# Patient Record
Sex: Male | Born: 1985 | Race: White | Hispanic: No | Marital: Married
Health system: Southern US, Community
[De-identification: ages and names within clinical notes are randomized; demographics above are authoritative.]

---

## 2014-01-29 ENCOUNTER — Emergency Department: Payer: Self-pay | Admitting: Emergency Medicine

## 2014-09-10 IMAGING — CR DG CHEST 2V
1 series · 2 of 2 positions shown · non-contrast
Comparison: No priors.

CLINICAL DATA: Left-sided chest pain for the past week.

EXAM:
CHEST  2 VIEW

[Series 1: w chest pa · 0.14mm/px · 2 of 2 slices shown]
[im 1/2]
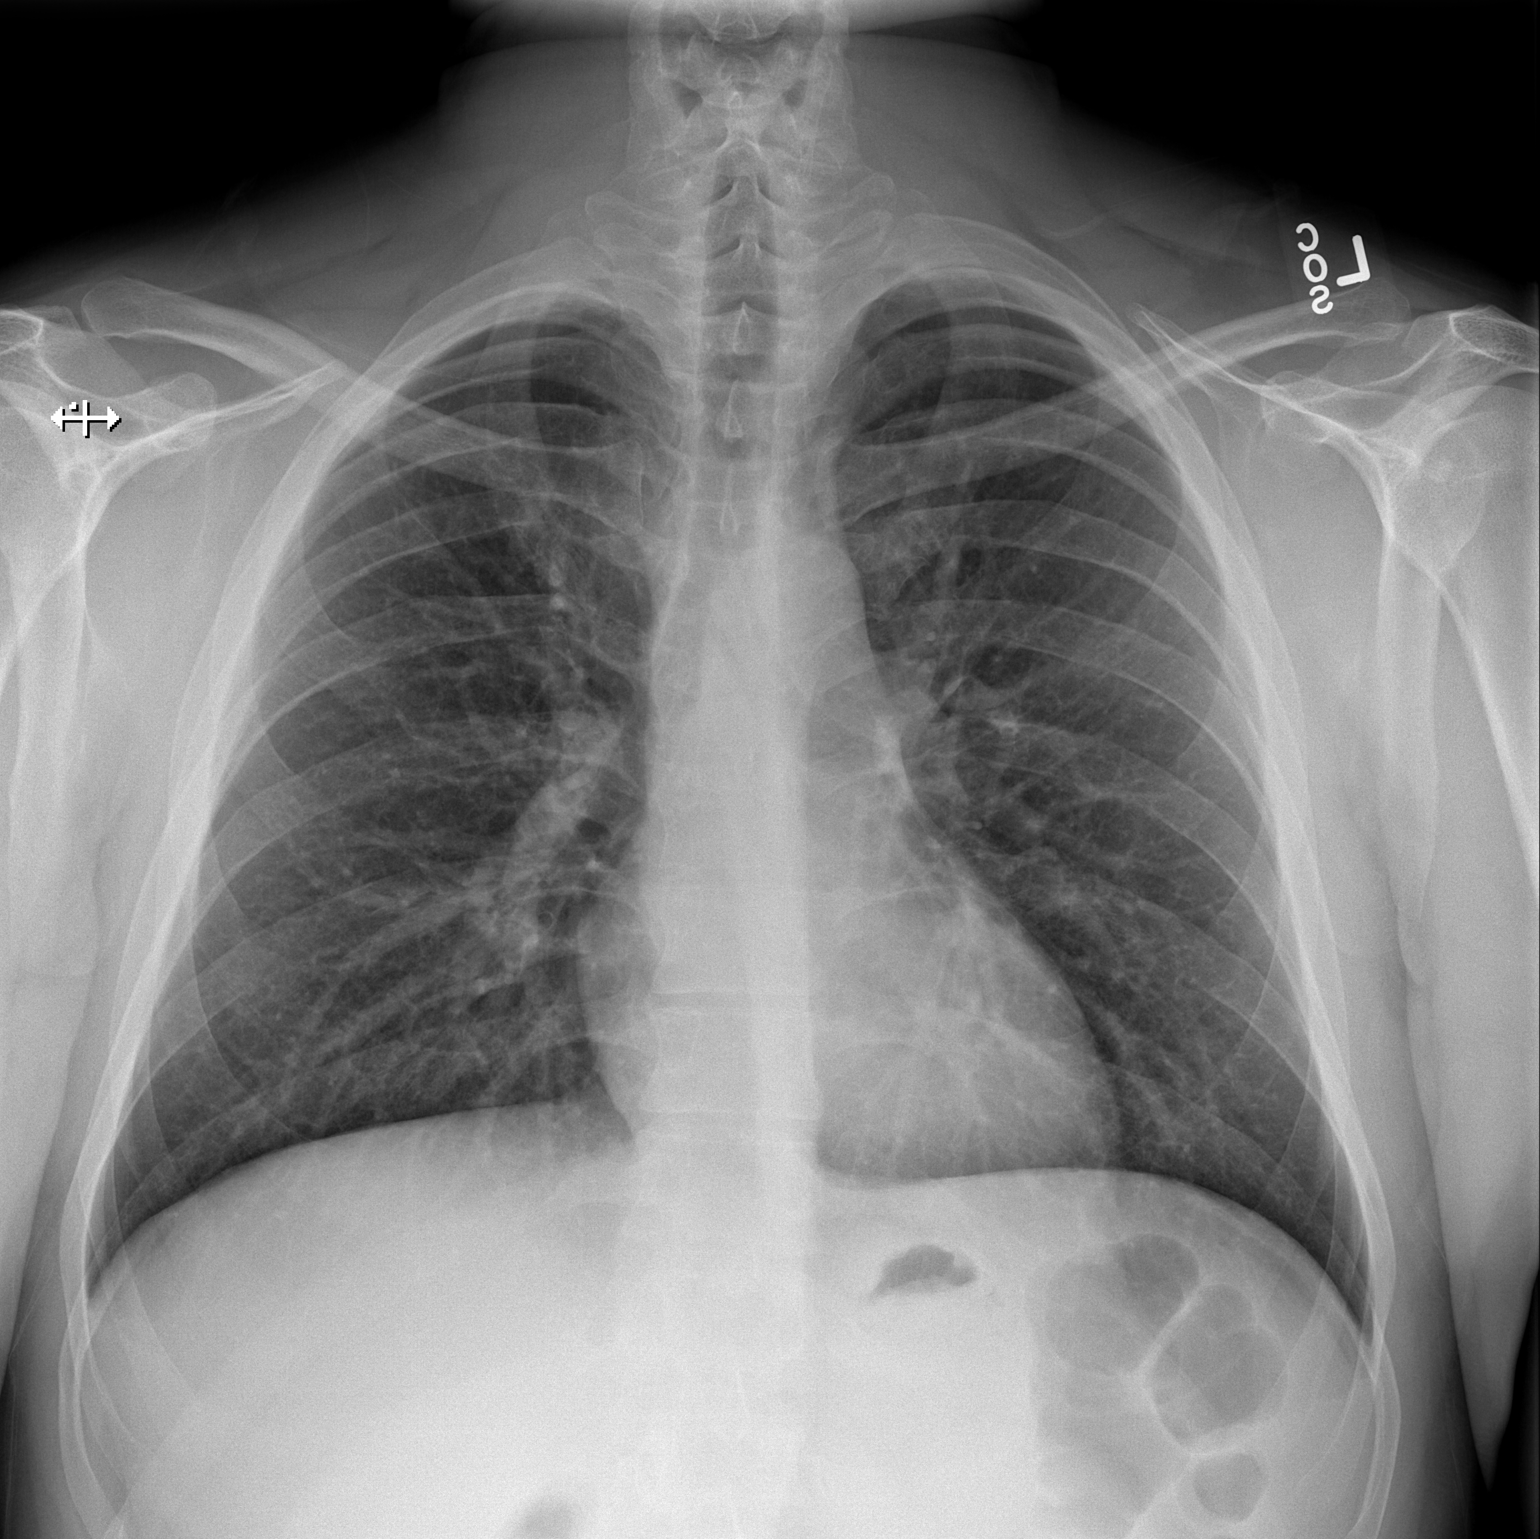
[im 2/2]
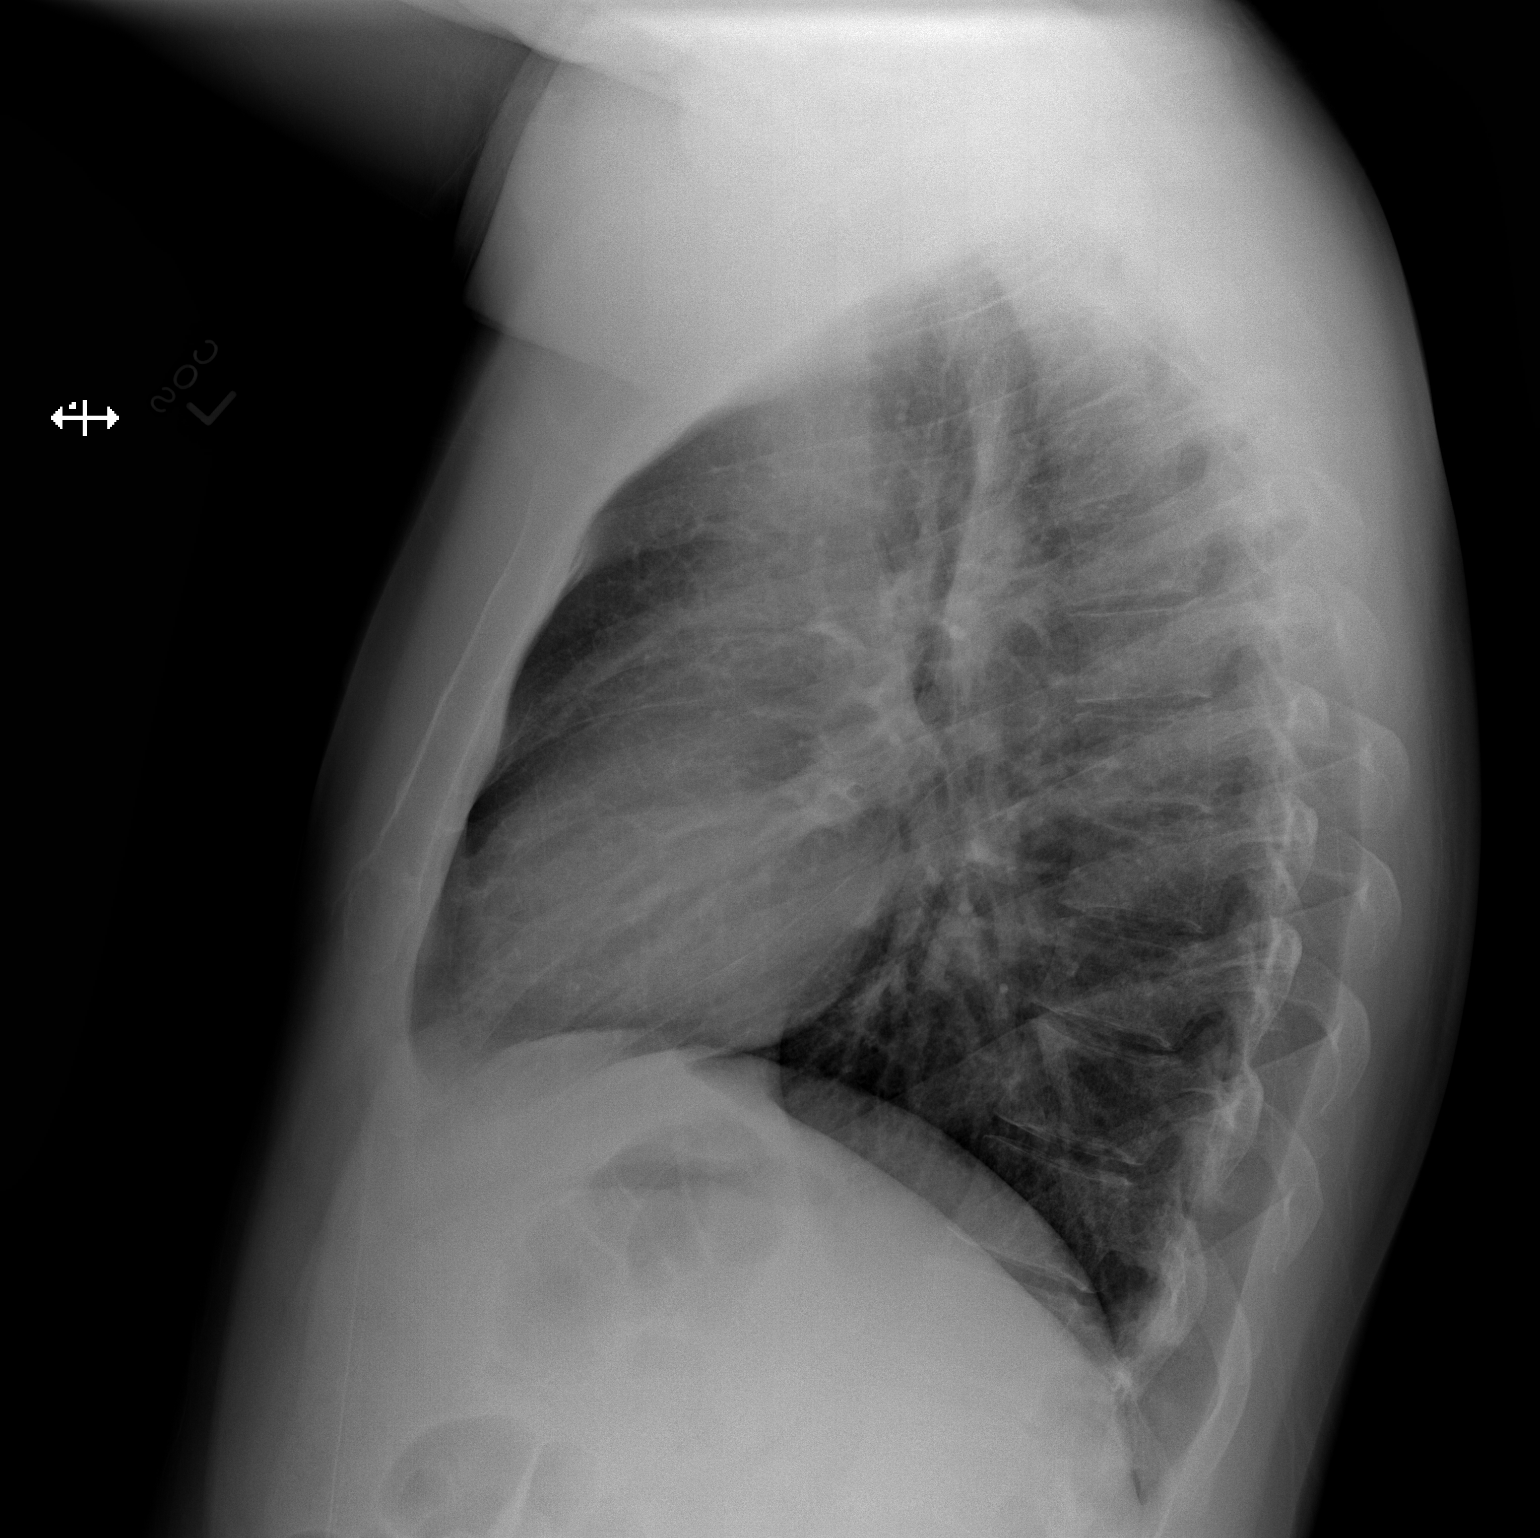

[2 of 2 positions shown; findings below may reference images not displayed]

FINDINGS: Lung volumes are normal. No consolidative airspace disease. No
pleural effusions. No pneumothorax. No pulmonary nodule or mass
noted. Pulmonary vasculature and the cardiomediastinal silhouette
are within normal limits.
IMPRESSION: No radiographic evidence of acute cardiopulmonary disease.

## 2016-02-06 ENCOUNTER — Emergency Department
Admission: EM | Admit: 2016-02-06 | Discharge: 2016-02-06 | Disposition: A | Discharge status: Home / Self Care | Payer: Self-pay | Attending: Student | Admitting: Student

## 2016-02-06 ENCOUNTER — Encounter: Payer: Self-pay | Admitting: *Deleted

## 2016-02-06 ENCOUNTER — Emergency Department: Payer: Self-pay

## 2016-02-06 DIAGNOSIS — R0789 Other chest pain: Secondary | ICD-10-CM | POA: Insufficient documentation

## 2016-02-06 LAB — CBC
HCT: 46.2 % (ref 40.0–52.0)
HEMOGLOBIN: 16 g/dL (ref 13.0–18.0)
MCH: 30 pg (ref 26.0–34.0)
MCHC: 34.6 g/dL (ref 32.0–36.0)
MCV: 86.5 fL (ref 80.0–100.0)
Platelets: 266 10*3/uL (ref 150–440)
RBC: 5.34 MIL/uL (ref 4.40–5.90)
RDW: 12.8 % (ref 11.5–14.5)
WBC: 7.4 10*3/uL (ref 3.8–10.6)

## 2016-02-06 LAB — BASIC METABOLIC PANEL
ANION GAP: 9 (ref 5–15)
BUN: 7 mg/dL (ref 6–20)
CHLORIDE: 103 mmol/L (ref 101–111)
CO2: 25 mmol/L (ref 22–32)
Calcium: 9.4 mg/dL (ref 8.9–10.3)
Creatinine, Ser: 1.11 mg/dL (ref 0.61–1.24)
GFR calc non Af Amer: >60 mL/min (ref >60)
Glucose, Bld: 103 mg/dL — ABNORMAL HIGH (ref 65–99)
Potassium: 3.8 mmol/L (ref 3.5–5.1)
Sodium: 137 mmol/L (ref 135–145)

## 2016-02-06 LAB — TROPONIN I

## 2016-02-06 MED ORDER — IBUPROFEN 600 MG PO TABS
600.0000 mg | ORAL_TABLET | Freq: Four times a day (QID) | ORAL | Status: AC | PRN
Start: 2016-02-06 — End: ?

## 2016-02-06 NOTE — ED Notes (Signed)
States mid chest pain for 1 week, states sharp in nature comes and goes, denies any SOB or nausea, awake and alert in no acute distress

## 2016-02-06 NOTE — ED Provider Notes (Signed)
[facility] Emergency Department Provider Note  ____________________________________________  Time seen: Approximately 6:01 PM  I have reviewed the triage vital signs and the nursing notes.   HISTORY  Chief Complaint Chest Pain    HPI Ronald Warner is a 30 y.o. male with no chronic medical problems who presents for evaluation of one week of anterior chest wall pain worse with movement, gradual onset, and nearly constant since onset, mild to moderate, not worsened with exertion, not pleuritic, not ripping or tearing in nature, not radiating to the back or down towards the feet. No shortness of breath. Patient reports that he was at a comedy show on the night of 01/31/2016. He reports that he laughed so hard that began coughing violently. He stepped out of the show and was coughing violently and developed chest pain. Since that time he has had soreness in his chest daily. Today it has been constant since awakening. No fevers or chills. No abdominal pain, vomiting or diarrhea. No history of coronary artery disease, no family history of early coronary artery disease. No family history of sudden cardiac death. No personal or family history of DVT or PE, no exogenous estrogen use, no hemoptysis, no recent surgeries, no recent prolonged period of immobilization or recent prolonged travel.   History reviewed. No pertinent past medical history.  There are no active problems to display for this patient.   No past surgical history on file.  No current outpatient prescriptions on file.  Allergies Review of patient's allergies indicates no known allergies.  History reviewed. No pertinent family history.  Social History Social History  Substance Use Topics  . Smoking status: None  . Smokeless tobacco: None  . Alcohol Use: None    Review of Systems Constitutional: No fever/chills Eyes: No visual changes. ENT: No sore throat. Cardiovascular: +chest  pain. Respiratory: Denies shortness of breath. Gastrointestinal: No abdominal pain.  No nausea, no vomiting.  No diarrhea.  No constipation. Genitourinary: Negative for dysuria. Musculoskeletal: Negative for back pain. Skin: Negative for rash. Neurological: Negative for headaches, focal weakness or numbness.  10-point ROS otherwise negative.  ____________________________________________   PHYSICAL EXAM:  VITAL SIGNS: ED Triage Vitals  Enc Vitals Group     BP 02/06/16 1703 139/92 mmHg     Pulse Rate 02/06/16 1703 84     Resp 02/06/16 1703 18     Temp 02/06/16 1703 98.6 F (37 C)     Temp Source 02/06/16 1703 Oral     SpO2 02/06/16 1703 99 %     Weight 02/06/16 1703 227 lb (102.967 kg)     Height 02/06/16 1703  (1.88 m)     Head Cir --      Peak Flow --      Pain Score 02/06/16 1705 5     Pain Loc --      Pain Edu? --      Excl. in GC? --     Constitutional: Alert and oriented. Well appearing and in no acute distress. Eyes: Conjunctivae are normal. PERRL. EOMI. Head: Atraumatic. Nose: No congestion/rhinnorhea. Mouth/Throat: Mucous membranes are moist.  Oropharynx non-erythematous. Neck: No stridor. Supple without meningismus. Cardiovascular: Normal rate, regular rhythm. Grossly normal heart sounds.  Good peripheral circulation. Respiratory: Normal respiratory effort.  No retractions. Lungs CTAB. Gastrointestinal: Soft and nontender. No distention. No CVA tenderness. Genitourinary: deferred Musculoskeletal: No lower extremity tenderness nor edema.  No joint effusions. Tenderness throughout the left anterior chest wall. Full rotation to the left  at the torso does exacerbate his pain. Neurologic:  Normal speech and language. No gross focal neurologic deficits are appreciated. No gait instability. Skin:  Skin is warm, dry and intact. No rash noted. Psychiatric: Mood and affect are normal. Speech and behavior are normal.  ____________________________________________    LABS (all labs ordered are listed, but only abnormal results are displayed)  Labs Reviewed  BASIC METABOLIC PANEL - Abnormal; Notable for the following:    Glucose, Bld 103 (*)    All other components within normal limits  CBC  TROPONIN I   ____________________________________________  EKG  ED ECG REPORT I, Gayla DossGayle, Iana Buzan A, the attending physician, personally viewed and interpreted this ECG.   Date: 02/06/2016  EKG Time: 17:03  Rate: 80  Rhythm: normal sinus rhythm with slightly short PR interval  Axis: normal  Intervals:right bundle branch block, incomplete  ST&T Change: No acute ST elevation. No acute ST depression.  ____________________________________________  RADIOLOGY  CXR  IMPRESSION: Negative chest. ____________________________________________   PROCEDURES  Procedure(s) performed: None  Critical Care performed: No  ____________________________________________   INITIAL IMPRESSION / ASSESSMENT AND PLAN / ED COURSE  Pertinent labs & imaging results that were available during my care of the patient were reviewed by me and considered in my medical decision making (see chart for details).  Ronald Warner is a 30 y.o. male with no chronic medical problems who presents for evaluation of one week of anterior chest wall pain worse with movement, constant today and worse with movement. On exam he is very well-appearing and in no acute distress. Vital signs are stable, he is afebrile. He has reproducible chest wall pain and I suspect he has musculoskeletal chest pain, likely rib strain secondary to cough. His EKG is generally reassuring, heart score is 1, troponin is less than 0.03 and I doubt that this represents ACS. He is perc negative and I doubt PE. Clinical picture is not consistent with acute aortic dissection. CBC BMP unremarkable, chest x-ray clear. We discussed treatment with ibuprofen, return precautions and need for close outpatient PCP follow-up and he is  comfortable to discharge plan. DC home. ____________________________________________   FINAL CLINICAL IMPRESSION(S) / ED DIAGNOSES  Final diagnoses:  Anterior chest wall pain      Gayla DossEryka A Kimie Pidcock, MD 02/06/16 1920
